# Patient Record
Sex: Male | Born: 1965 | Hispanic: No | Marital: Married | Smoking: Never smoker
Health system: Southern US, Community
[De-identification: ages and names within clinical notes are randomized; demographics above are authoritative.]

---

## 2010-10-16 ENCOUNTER — Ambulatory Visit
Admission: RE | Admit: 2010-10-16 | Discharge: 2010-10-16 | Disposition: A | Discharge status: Home / Self Care | Payer: No Typology Code available for payment source | Source: Ambulatory Visit | Attending: Specialist | Admitting: Specialist

## 2010-10-16 ENCOUNTER — Other Ambulatory Visit: Payer: Self-pay | Admitting: Specialist

## 2010-10-16 DIAGNOSIS — R6889 Other general symptoms and signs: Secondary | ICD-10-CM

## 2012-06-25 IMAGING — CR DG CHEST 1V
1 series · 1 of 1 positions shown · non-contrast
Comparison: None.

CLINICAL DATA: Positive blood test for TB

CHEST - 1 VIEW

[w chest pa]
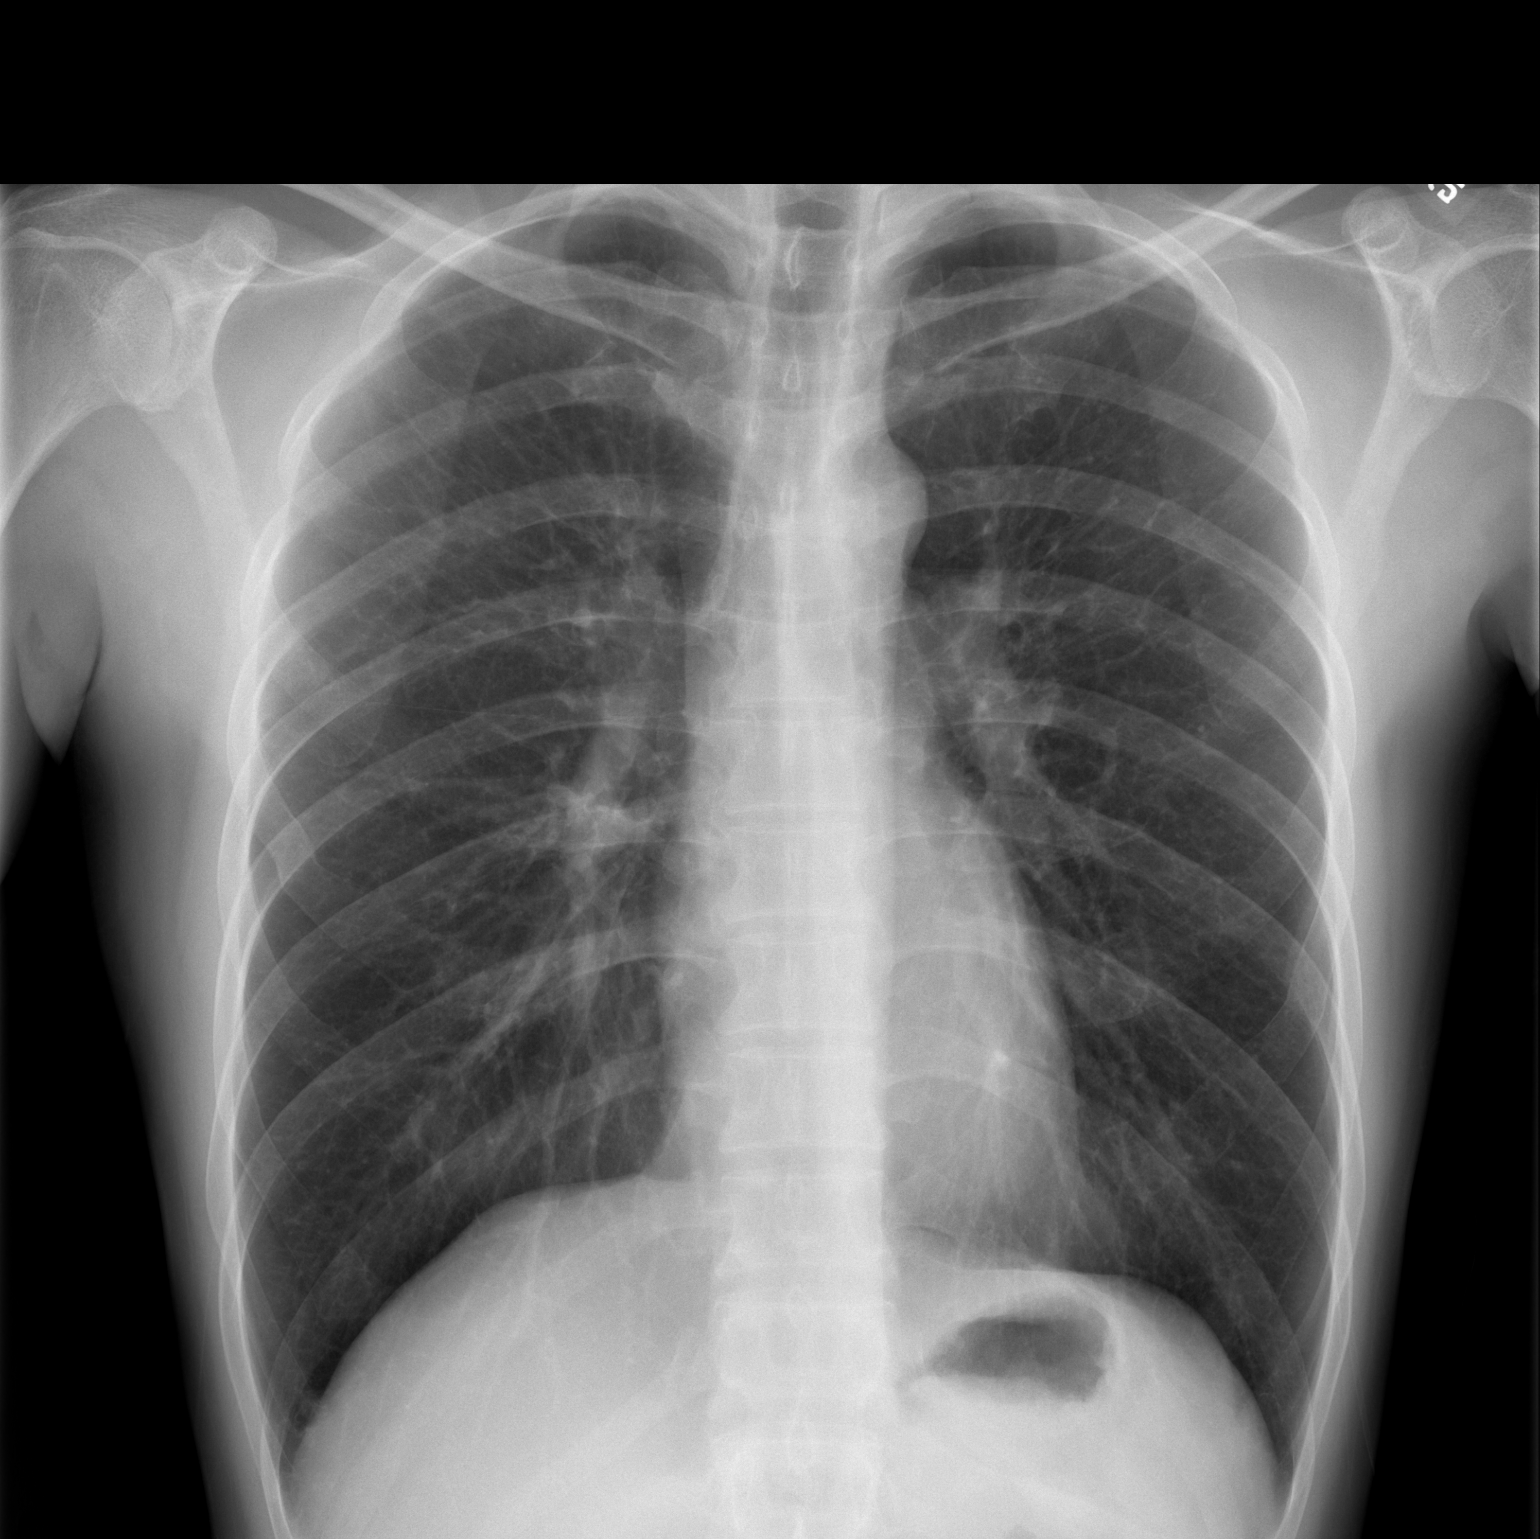

[1 of 1 positions shown; findings below may reference images not displayed]

FINDINGS: The lungs are clear but hyperaerated.  Mediastinal
contours appear normal.  The heart is within normal limits in size.
No bony abnormality is seen.
IMPRESSION: Hyperaeration.  No active lung disease.

## 2019-08-27 ENCOUNTER — Other Ambulatory Visit: Payer: Self-pay

## 2019-08-27 ENCOUNTER — Ambulatory Visit (HOSPITAL_COMMUNITY)
Admission: EM | Admit: 2019-08-27 | Discharge: 2019-08-27 | Disposition: A | Discharge status: Home / Self Care | Payer: Self-pay

## 2019-08-27 ENCOUNTER — Encounter (HOSPITAL_COMMUNITY): Payer: Self-pay | Admitting: Emergency Medicine

## 2019-08-27 DIAGNOSIS — R300 Dysuria: Secondary | ICD-10-CM

## 2019-08-27 DIAGNOSIS — N23 Unspecified renal colic: Secondary | ICD-10-CM

## 2019-08-27 DIAGNOSIS — R319 Hematuria, unspecified: Secondary | ICD-10-CM

## 2019-08-27 DIAGNOSIS — R109 Unspecified abdominal pain: Secondary | ICD-10-CM

## 2019-08-27 LAB — POCT URINALYSIS DIPSTICK, ED / UC
Glucose, UA: NEGATIVE mg/dL
Ketones, ur: 40 mg/dL — AB
Leukocytes,Ua: NEGATIVE
Nitrite: NEGATIVE
Protein, ur: 100 mg/dL — AB
Specific Gravity, Urine: >=1.03 (ref 1.005–1.030)
Urobilinogen, UA: 0.2 mg/dL (ref 0.0–1.0)
pH: 5.5 (ref 5.0–8.0)

## 2019-08-27 MED ORDER — TRAMADOL HCL 50 MG PO TABS: 50.0000 mg | ORAL_TABLET | Freq: Four times a day (QID) | ORAL | 0 refills | Status: AC | PRN

## 2019-08-27 MED ORDER — TAMSULOSIN HCL 0.4 MG PO CAPS: 0.4000 mg | ORAL_CAPSULE | Freq: Every day | ORAL | 0 refills | Status: AC

## 2019-08-27 NOTE — Discharge Instructions (Addendum)
Please make sure you hydrate very well with at least 64 ounces of water daily.  That is 2 L of water.  Start tamsulosin (Flomax) once daily to help you pass your kidney stone.  Please schedule Tylenol at 500 mg - 650 mg once every 6 hours as needed for aches and pains.  If you still have pain despite taking Tylenol regularly, this is breakthrough pain.  You can use tramadol once every 6 hours for this.  Once your pain is better controlled, switch back to just Tylenol.  If your pain that was severe this morning returns, he stopped being able to urinate and started having really high fevers, vomiting, severe belly pain or flank pain then please report to the emergency room.

## 2019-08-27 NOTE — ED Provider Notes (Signed)
[facility]   MRN: [id number] DOB: [birth year]  Subjective:   Adam Goodwin is a 54 y.o. male presenting for 3-day history of acute onset left-sided flank pain, dysuria and trouble urinating.  This improved today including his belly pain but was the most severe this morning.  Currently states that he has very little pain.  Has noticed some blood in his urine.  Denies active fever, belly pain, nausea, vomiting, oliguria, anuria.  Denies history of kidney stone.  Of note, patient did take his Covid vaccine for the first dose on 08/19/2019.  No current facility-administered medications for this encounter.  Current Outpatient Medications:  .  LORazepam (ATIVAN) 0.5 MG tablet, Take 0.5 mg by mouth every 8 (eight) hours as needed for anxiety., Disp: , Rfl:    No Known Allergies  History reviewed. No pertinent past medical history.   History reviewed. No pertinent surgical history.  History reviewed. No pertinent family history.  Social History   Tobacco Use  . Smoking status: Never Smoker  . Smokeless tobacco: Never Used  Substance Use Topics  . Alcohol use: Never  . Drug use: Never    ROS   Objective:   Vitals: BP 119/84 (BP Location: Right Arm)   Pulse 81   Temp 98.4 F (36.9 C) (Oral)   Resp 16   SpO2 98%   Physical Exam Constitutional:      General: He is not in acute distress.    Appearance: Normal appearance. He is well-developed. He is not ill-appearing, toxic-appearing or diaphoretic.  HENT:     Head: Normocephalic and atraumatic.     Right Ear: External ear normal.     Left Ear: External ear normal.     Nose: Nose normal.     Mouth/Throat:     Mouth: Mucous membranes are moist.     Pharynx: Oropharynx is clear.  Eyes:     General: No scleral icterus.       Right eye: No discharge.        Left eye: No discharge.     Extraocular Movements: Extraocular movements intact.     Conjunctiva/sclera: Conjunctivae normal.     Pupils: Pupils are equal,  round, and reactive to light.  Cardiovascular:     Rate and Rhythm: Normal rate and regular rhythm.     Heart sounds: Normal heart sounds. No murmur heard.  No friction rub. No gallop.   Pulmonary:     Effort: Pulmonary effort is normal. No respiratory distress.     Breath sounds: Normal breath sounds. No stridor. No wheezing, rhonchi or rales.  Abdominal:     General: Bowel sounds are normal. There is no distension.     Palpations: Abdomen is soft. There is no mass.     Tenderness: There is abdominal tenderness. There is no right CVA tenderness, left CVA tenderness, guarding or rebound.    Neurological:     Mental Status: He is alert and oriented to person, place, and time.  Psychiatric:        Mood and Affect: Mood normal.        Behavior: Behavior normal.        Thought Content: Thought content normal.        Judgment: Judgment normal.     Results for orders placed or performed during the hospital encounter of 08/27/19 (from the past 24 hour(s))  POCT Urinalysis Dipstick (ED/UC)     Status: Abnormal   Collection Time: 08/27/19  3:34 PM  Result Value Ref Range   Glucose, UA NEGATIVE NEGATIVE mg/dL   Bilirubin Urine SMALL (A) NEGATIVE   Ketones, ur 40 (A) NEGATIVE mg/dL   Specific Gravity, Urine >=1.030 1.005 - 1.030   Hgb urine dipstick LARGE (A) NEGATIVE   pH 5.5 5.0 - 8.0   Protein, ur 100 (A) NEGATIVE mg/dL   Urobilinogen, UA 0.2 0.0 - 1.0 mg/dL   Nitrite NEGATIVE NEGATIVE   Leukocytes,Ua NEGATIVE NEGATIVE    Assessment and Plan :   I have reviewed the PDMP during this encounter.  1. Left flank pain   2. Hematuria, unspecified type   3. Dysuria   4. Renal colic     Suspect his current symptoms are not related to his Covid vaccine.  Patient is very well-appearing, has minimal tenderness on exam and normal vital signs.  Stable for management of renal colic as an outpatient.  Schedule Tylenol, use tramadol for breakthrough pain.  Start Flomax and hydrate  aggressively. Counseled patient on potential for adverse effects with medications prescribed/recommended today, ER and return-to-clinic precautions discussed, patient verbalized understanding.    Wallis Bamberg, [place] 08/27/19 [phone]

## 2019-08-27 NOTE — ED Triage Notes (Signed)
Pt c/o possible kidney stone with pain beginning approx Friday night on left flank. Some dysuria, slight blood in urine noted. Denies fever.
